# Patient Record
Sex: Female | Born: 1985 | Race: White | Hispanic: No | Marital: Married | Smoking: Never smoker
Health system: Southern US, Community
[De-identification: ages and names within clinical notes are randomized; demographics above are authoritative.]

## PROBLEM LIST (undated history)

## (undated) ENCOUNTER — Inpatient Hospital Stay (HOSPITAL_COMMUNITY): Payer: Self-pay

## (undated) DIAGNOSIS — E282 Polycystic ovarian syndrome: Secondary | ICD-10-CM

## (undated) DIAGNOSIS — K589 Irritable bowel syndrome without diarrhea: Secondary | ICD-10-CM

## (undated) DIAGNOSIS — K219 Gastro-esophageal reflux disease without esophagitis: Secondary | ICD-10-CM

## (undated) DIAGNOSIS — F411 Generalized anxiety disorder: Secondary | ICD-10-CM

## (undated) DIAGNOSIS — F32A Depression, unspecified: Secondary | ICD-10-CM

## (undated) DIAGNOSIS — D649 Anemia, unspecified: Secondary | ICD-10-CM

## (undated) DIAGNOSIS — M1990 Unspecified osteoarthritis, unspecified site: Secondary | ICD-10-CM

## (undated) HISTORY — PX: DRUG INDUCED ENDOSCOPY: SHX6808

---

## 2007-10-15 ENCOUNTER — Ambulatory Visit (HOSPITAL_COMMUNITY): Admission: RE | Admit: 2007-10-15 | Discharge: 2007-10-15 | Payer: Self-pay | Admitting: Obstetrics & Gynecology

## 2007-11-13 ENCOUNTER — Ambulatory Visit (HOSPITAL_COMMUNITY): Admission: RE | Admit: 2007-11-13 | Discharge: 2007-11-13 | Payer: Self-pay | Admitting: Obstetrics and Gynecology

## 2008-01-07 ENCOUNTER — Inpatient Hospital Stay (HOSPITAL_COMMUNITY): Admission: AD | Admit: 2008-01-07 | Discharge: 2008-01-10 | Payer: Self-pay | Admitting: Obstetrics and Gynecology

## 2011-03-13 LAB — RH IMMUNE GLOBULIN WORKUP (NOT WOMEN'S HOSP): ABO/RH(D): O NEG

## 2011-03-13 LAB — CBC
HCT: 32.8 — ABNORMAL LOW
Hemoglobin: 11.4 — ABNORMAL LOW
MCHC: 34.6
RBC: 3.85 — ABNORMAL LOW
RDW: 12.8

## 2011-03-13 LAB — RPR: RPR Ser Ql: NONREACTIVE

## 2011-03-16 LAB — CBC
HCT: 30.2 — ABNORMAL LOW
MCHC: 33
MCV: 79.1
Platelets: 206
Platelets: 259
RBC: 4.21
RDW: 14.7
WBC: 10.9 — ABNORMAL HIGH
WBC: 16.3 — ABNORMAL HIGH

## 2011-03-16 LAB — RPR: RPR Ser Ql: NONREACTIVE

## 2011-03-26 ENCOUNTER — Other Ambulatory Visit: Payer: Self-pay | Admitting: Family Medicine

## 2011-03-26 DIAGNOSIS — R102 Pelvic and perineal pain: Secondary | ICD-10-CM

## 2011-03-29 ENCOUNTER — Ambulatory Visit (HOSPITAL_COMMUNITY)
Admission: RE | Admit: 2011-03-29 | Discharge: 2011-03-29 | Disposition: A | Discharge status: Home / Self Care | Payer: BC Managed Care – PPO | Source: Ambulatory Visit | Attending: Family Medicine | Admitting: Family Medicine

## 2011-03-29 DIAGNOSIS — R102 Pelvic and perineal pain: Secondary | ICD-10-CM

## 2011-03-29 DIAGNOSIS — N949 Unspecified condition associated with female genital organs and menstrual cycle: Secondary | ICD-10-CM | POA: Insufficient documentation

## 2012-06-18 NOTE — L&D Delivery Note (Signed)
Delivery Note At 8:50 AM a viable and healthy female was delivered via Vaginal, Spontaneous Delivery (Presentation: Left Occiput Anterior).  APGAR: 9, 9; weight pending.   Placenta status: Intact, Spontaneous.  Cord: 3 vessels with the following complications: None.    Anesthesia:  Epidural Episiotomy: None Lacerations: None Suture Repair: none Est. Blood Loss (mL): 250  Mom to postpartum.  Baby to nursery-stable.  Everardo Voris H. 03/26/2013, 10:04 AM

## 2012-09-08 LAB — OB RESULTS CONSOLE HIV ANTIBODY (ROUTINE TESTING): HIV: NONREACTIVE

## 2012-10-20 IMAGING — US US PELVIS COMPLETE
1 series · 14 of 25 positions shown · non-contrast
Comparison: Obstetric ultrasound 11/13/2007.

CLINICAL DATA: Pelvic pain.

TRANSABDOMINAL AND TRANSVAGINAL ULTRASOUND OF PELVIS
TECHNIQUE: Both transabdominal and transvaginal ultrasound
examinations of the pelvis were performed. Transabdominal technique
was performed for global imaging of the pelvis including uterus,
ovaries, adnexal regions, and pelvic cul-de-sac.

[Series 1: us pelvis complete · 0.30mm/px · 14 of 52 slices shown]
[im 1/52]
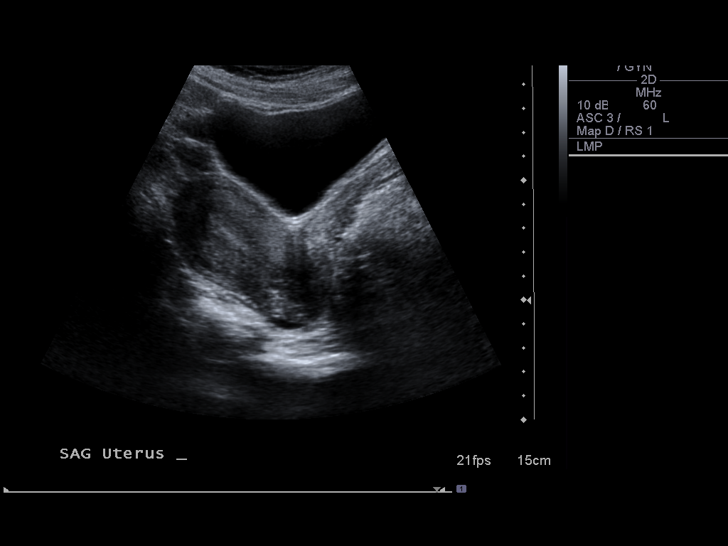
[im 5/52]
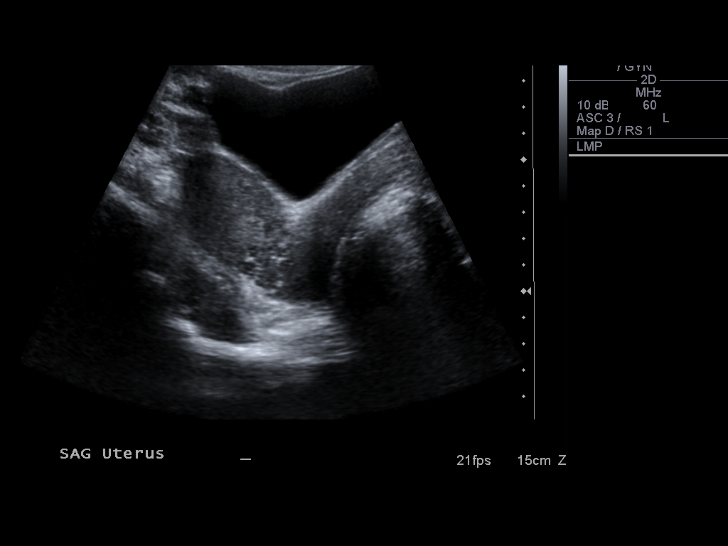
[im 9/52]
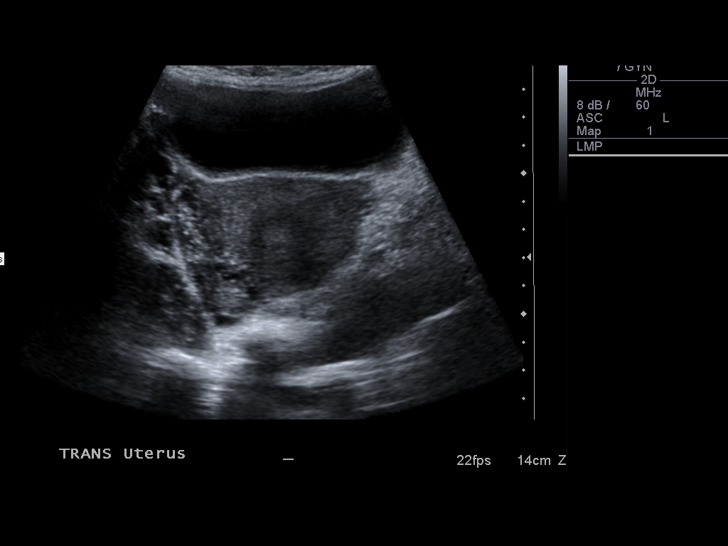
[im 13/52]
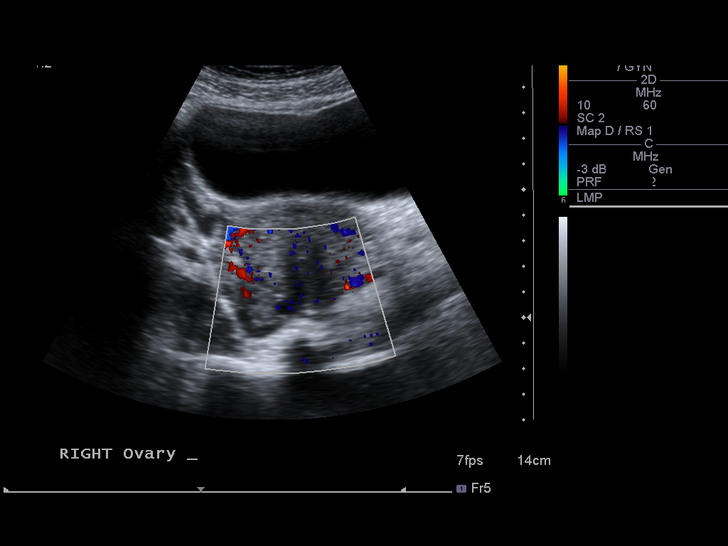
[im 18/52]
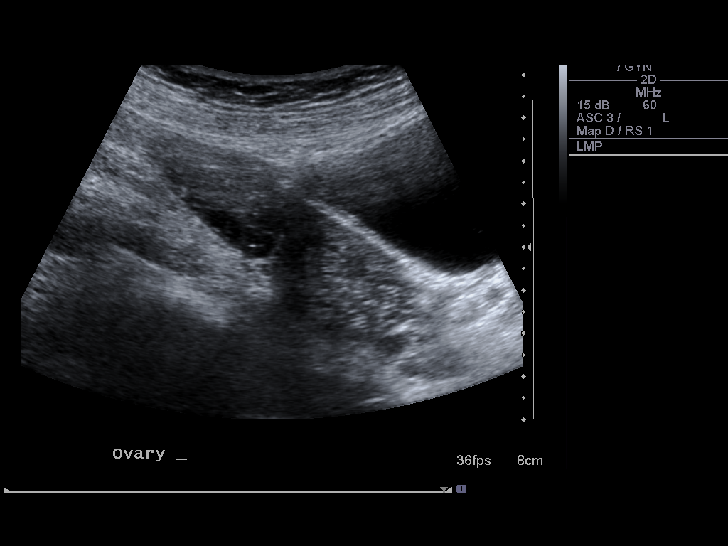
[im 20/52]
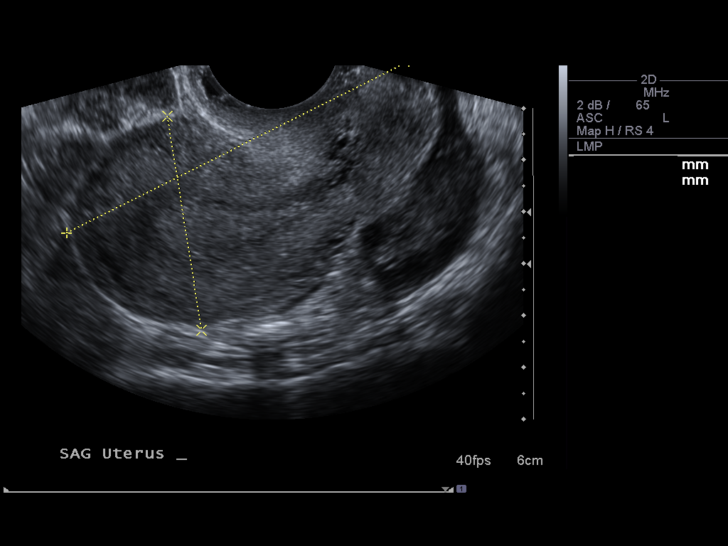
[im 24/52]
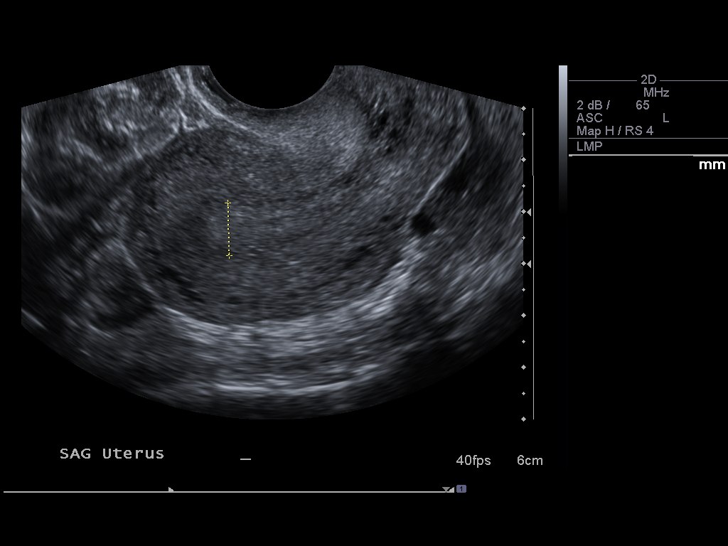
[im 28/52]
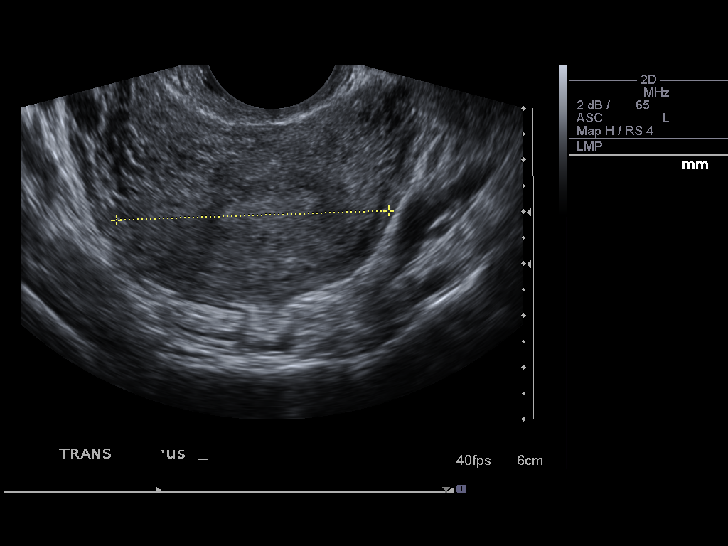
[im 32/52]
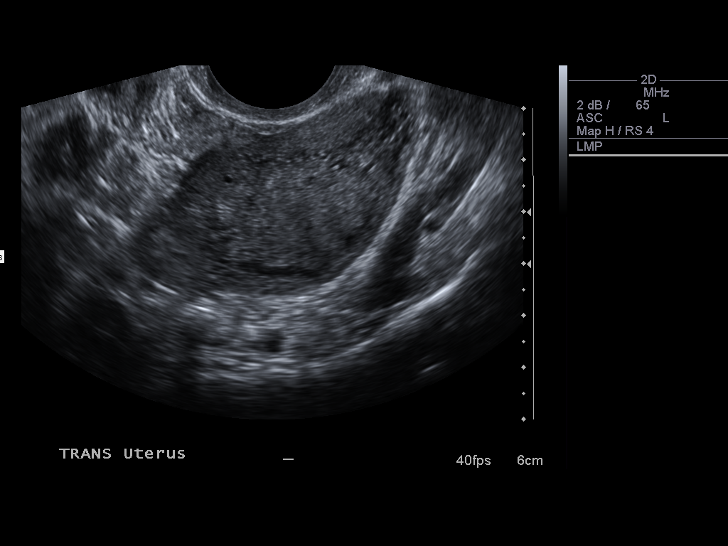
[im 35/52]
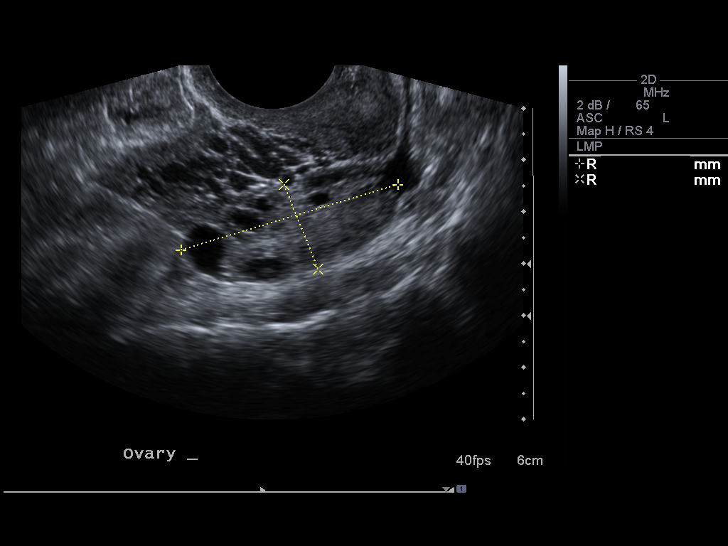
[im 39/52]
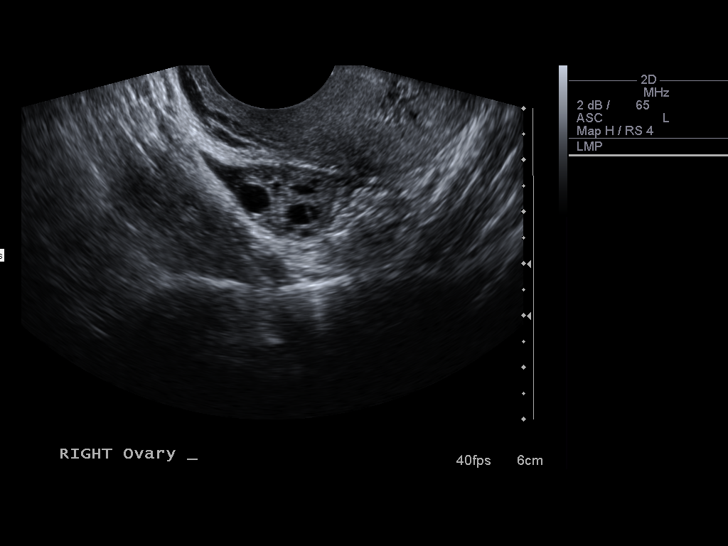
[im 43/52]
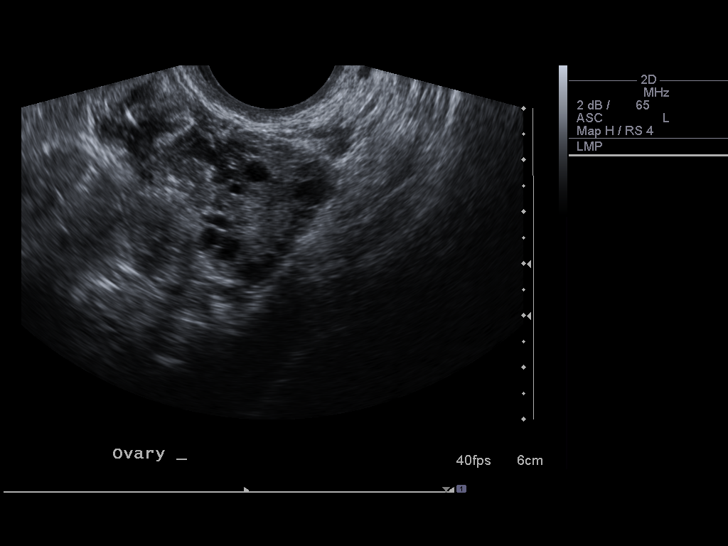
[im 47/52]
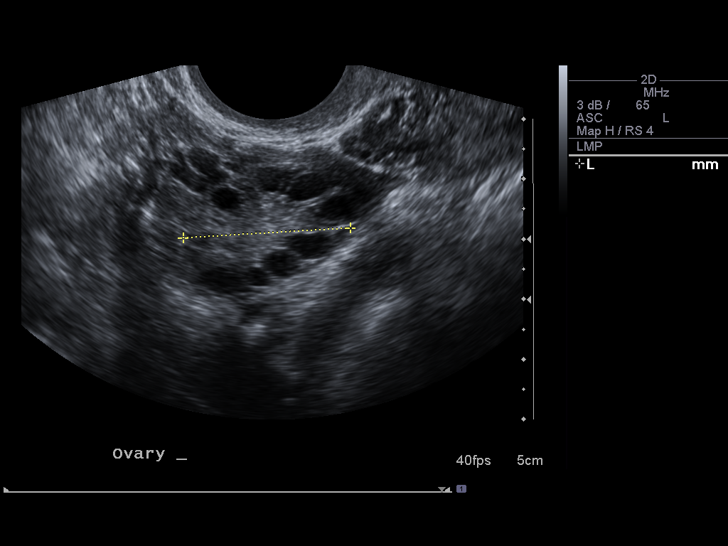
[im 52/52]
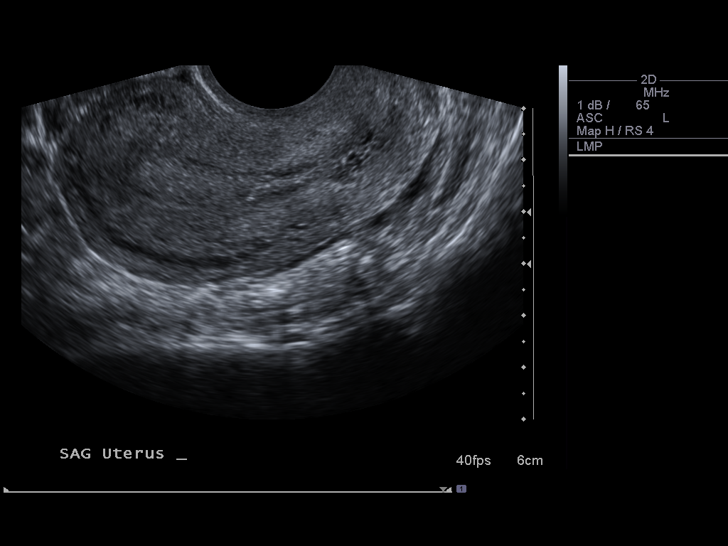

[14 of 25 positions shown; findings below may reference images not displayed]

It was necessary to proceed with endovaginal exam following the
transabdominal exam to visualize the uterus, ovaries, and adnexa  .
FINDINGS: Uterus: Normal in size and appearance

Endometrium: Normal, at 10 mm.

Right ovary:  Normal appearance/no adnexal mass.  4.4 x 1.8 x
cm

Left ovary: Normal appearance/no adnexal mass.  3.0 x 1.8 x 2.8 cm.

Other findings: Minimal fluid adjacent the right ovary, likely
physiologic.
IMPRESSION: Normal pelvic ultrasound for age.

## 2012-11-10 ENCOUNTER — Encounter (HOSPITAL_COMMUNITY): Payer: Self-pay

## 2012-11-10 ENCOUNTER — Inpatient Hospital Stay (HOSPITAL_COMMUNITY)
Admission: AD | Admit: 2012-11-10 | Discharge: 2012-11-10 | Disposition: A | Discharge status: Home / Self Care | Payer: No Typology Code available for payment source | Source: Ambulatory Visit | Attending: Obstetrics and Gynecology | Admitting: Obstetrics and Gynecology

## 2012-11-10 DIAGNOSIS — M545 Low back pain, unspecified: Secondary | ICD-10-CM | POA: Insufficient documentation

## 2012-11-10 DIAGNOSIS — O99891 Other specified diseases and conditions complicating pregnancy: Secondary | ICD-10-CM

## 2012-11-10 DIAGNOSIS — Y92410 Unspecified street and highway as the place of occurrence of the external cause: Secondary | ICD-10-CM | POA: Insufficient documentation

## 2012-11-10 HISTORY — DX: Generalized anxiety disorder: F41.1

## 2012-11-10 HISTORY — DX: Polycystic ovarian syndrome: E28.2

## 2012-11-10 LAB — URINALYSIS, ROUTINE W REFLEX MICROSCOPIC
Leukocytes, UA: NEGATIVE
Nitrite: NEGATIVE
Specific Gravity, Urine: 1.025 (ref 1.005–1.030)
Urobilinogen, UA: 1 mg/dL (ref 0.0–1.0)
pH: 6 (ref 5.0–8.0)
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
## 2012-11-19 NOTE — MAU Provider Note (Signed)
Agree with above, confirmed that tracing was reactive with no contractions.

## 2013-01-01 ENCOUNTER — Inpatient Hospital Stay (HOSPITAL_COMMUNITY)
Admission: AD | Admit: 2013-01-01 | Discharge: 2013-01-01 | Disposition: A | Discharge status: Home / Self Care | Payer: Federal, State, Local not specified - PPO | Source: Ambulatory Visit | Attending: Obstetrics and Gynecology | Admitting: Obstetrics and Gynecology

## 2013-01-01 ENCOUNTER — Other Ambulatory Visit: Payer: Self-pay

## 2013-01-01 ENCOUNTER — Encounter (HOSPITAL_COMMUNITY): Payer: Self-pay | Admitting: *Deleted

## 2013-01-01 DIAGNOSIS — I498 Other specified cardiac arrhythmias: Secondary | ICD-10-CM

## 2013-01-01 DIAGNOSIS — O99891 Other specified diseases and conditions complicating pregnancy: Secondary | ICD-10-CM

## 2013-01-01 DIAGNOSIS — Z3492 Encounter for supervision of normal pregnancy, unspecified, second trimester: Secondary | ICD-10-CM

## 2013-01-01 DIAGNOSIS — R002 Palpitations: Secondary | ICD-10-CM

## 2013-01-01 LAB — GLUCOSE, CAPILLARY: Glucose-Capillary: 54 mg/dL — ABNORMAL LOW (ref 70–99)

## 2013-01-01 NOTE — MAU Note (Signed)
Sent from office for further eval, EKG

## 2013-01-01 NOTE — MAU Provider Note (Signed)
  History     CSN: [id number]  Arrival date and time: 01/01/13 1644   None     Chief Complaint  Patient presents with  . Palpitations   HPI Cassandra Mckay is 27 y.o. G2P1001 [redacted]w[redacted]d weeks sent from the office by Dr. Henderson Cloud when the patient reported she had had palpitations.  They began 1 week ago and thought it was the caffeine in the coke she had a dinner.  Resting heart rate at that time was "70 but thought it was skipping".  Lasted 6 hrs then stopped and has not recurred.   Had glucose screening today at 3:20.  Hasn't eaten since 2 hrs before the test.     Past Medical History  Diagnosis Date  . PCOS (polycystic ovarian syndrome)   . GAD (generalized anxiety disorder)     Not currently    Past Surgical History  Procedure Laterality Date  . Vaginal delivery      Family History  Problem Relation Age of Onset  . Thyroid disease Mother   . Thyroid disease Maternal Grandmother     History  Substance Use Topics  . Smoking status: Never Smoker   . Smokeless tobacco: Never Used  . Alcohol Use: No    Allergies:  Allergies  Allergen Reactions  . Benadryl (Diphenhydramine Hcl) Other (See Comments)    Family history; Pt does not want to try.    Prescriptions prior to admission  Medication Sig Dispense Refill  . acetaminophen (TYLENOL) 325 MG tablet Take 325 mg by mouth every 6 (six) hours as needed for fever.      Marland Kitchen CALCIUM PO Take 1 tablet by mouth daily.      Marland Kitchen FIBER SELECT GUMMIES PO Take by mouth.      . loratadine (CLARITIN) 10 MG tablet Take 10 mg by mouth daily.      . Prenatal Vit-Fe Fumarate-FA (PRENATAL MULTIVITAMIN) TABS Take 1 tablet by mouth daily at 12 noon.        Review of Systems  Constitutional: Negative for fever and chills.  Cardiovascular: Positive for palpitations (1 week ago). Negative for chest pain.  Gastrointestinal: Negative for nausea, vomiting and abdominal pain.  Neurological: Negative for headaches.   Physical Exam   Blood  pressure 112/67, pulse 84, resp. rate 16.  Physical Exam  Constitutional: She is oriented to person, place, and time. She appears well-developed and well-nourished. No distress.  Cardiovascular: Normal rate.   Respiratory: Effort normal.  Genitourinary:  Not indicated  Neurological: She is alert and oriented to person, place, and time.  Skin: Skin is warm and dry.  Psychiatric: She has a normal mood and affect. Her behavior is normal.    MAU Course  Procedures  FMS--Reactive.  Baseline 130 No decels, no contractions                      EKG--nSR with Sinus arrhythmia  "Normal ECG.  Rate 70  MDM 17:35  Reported MSE, EKG results to Dr. Tenny Craw.  Order given for discharge.  To report any further palpitation.  Patient denies palpitations while in MAU  Assessment and Plan  A:  Recent palpitations      Normal EKG  P:  Report palpitation recurrence to Dr. Fenton Foy M 01/01/2013, 5:33 PM

## 2013-02-26 ENCOUNTER — Other Ambulatory Visit: Payer: Self-pay

## 2013-02-26 LAB — OB RESULTS CONSOLE ABO/RH: RH Type: NEGATIVE

## 2013-02-26 LAB — OB RESULTS CONSOLE RPR: RPR: NONREACTIVE

## 2013-02-26 LAB — OB RESULTS CONSOLE ANTIBODY SCREEN: Antibody Screen: NEGATIVE

## 2013-03-25 ENCOUNTER — Inpatient Hospital Stay (HOSPITAL_COMMUNITY)
Admission: AD | Admit: 2013-03-25 | Discharge: 2013-03-25 | Disposition: A | Discharge status: Home / Self Care | Payer: Federal, State, Local not specified - PPO | Source: Ambulatory Visit | Attending: Obstetrics and Gynecology | Admitting: Obstetrics and Gynecology

## 2013-03-25 ENCOUNTER — Encounter (HOSPITAL_COMMUNITY): Payer: Self-pay | Admitting: *Deleted

## 2013-03-25 DIAGNOSIS — O479 False labor, unspecified: Secondary | ICD-10-CM | POA: Insufficient documentation

## 2013-03-25 NOTE — MAU Note (Signed)
Patient presents to MAU with c/o of contractions 7-10 minutes apart. States lost mucus plug today. Denies LOF nor bleeding at this time. REports good fetal movement. States was last checked Friday being 1.5cm/50%.

## 2013-03-26 ENCOUNTER — Encounter (HOSPITAL_COMMUNITY): Payer: Federal, State, Local not specified - PPO | Admitting: Anesthesiology

## 2013-03-26 ENCOUNTER — Encounter (HOSPITAL_COMMUNITY): Payer: Self-pay

## 2013-03-26 ENCOUNTER — Inpatient Hospital Stay (HOSPITAL_COMMUNITY): Payer: Federal, State, Local not specified - PPO | Admitting: Anesthesiology

## 2013-03-26 ENCOUNTER — Inpatient Hospital Stay (HOSPITAL_COMMUNITY)
Admission: AD | Admit: 2013-03-26 | Discharge: 2013-03-27 | DRG: 373 | Disposition: A | Discharge status: Home / Self Care | Payer: Federal, State, Local not specified - PPO | Source: Ambulatory Visit | Attending: Obstetrics and Gynecology | Admitting: Obstetrics and Gynecology

## 2013-03-26 DIAGNOSIS — E282 Polycystic ovarian syndrome: Secondary | ICD-10-CM | POA: Diagnosis present

## 2013-03-26 DIAGNOSIS — O34599 Maternal care for other abnormalities of gravid uterus, unspecified trimester: Principal | ICD-10-CM | POA: Diagnosis present

## 2013-03-26 DIAGNOSIS — O479 False labor, unspecified: Secondary | ICD-10-CM

## 2013-03-26 LAB — CBC
Hemoglobin: 10.5 g/dL — ABNORMAL LOW (ref 12.0–15.0)
MCHC: 32.9 g/dL (ref 30.0–36.0)
Platelets: 228 10*3/uL (ref 150–400)
RBC: 4.2 MIL/uL (ref 3.87–5.11)
RDW: 13.7 % (ref 11.5–15.5)

## 2013-03-26 LAB — RPR: RPR Ser Ql: NONREACTIVE

## 2013-03-26 LAB — POCT FERN TEST: POCT Fern Test: POSITIVE

## 2013-03-26 LAB — OB RESULTS CONSOLE GBS: GBS: NEGATIVE

## 2013-03-26 MED ORDER — OXYTOCIN 40 UNITS IN LACTATED RINGERS INFUSION - SIMPLE MED
62.5000 mL/h | INTRAVENOUS | Status: DC
  Filled 2013-03-26: qty 1000

## 2013-03-26 MED ORDER — TETANUS-DIPHTH-ACELL PERTUSSIS 5-2.5-18.5 LF-MCG/0.5 IM SUSP
0.5000 mL | Freq: Once | INTRAMUSCULAR | Status: AC
  Administered 2013-03-27: 0.5 mL via INTRAMUSCULAR
  Filled 2013-03-26: qty 0.5

## 2013-03-26 MED ORDER — FENTANYL 2.5 MCG/ML BUPIVACAINE 1/10 % EPIDURAL INFUSION (WH - ANES)
14.0000 mL/h | INTRAMUSCULAR | Status: DC | PRN
  Administered 2013-03-26: 14 mL/h via EPIDURAL
  Filled 2013-03-26: qty 125

## 2013-03-26 MED ORDER — BENZOCAINE-MENTHOL 20-0.5 % EX AERO: 1.0000 "application " | INHALATION_SPRAY | CUTANEOUS | Status: DC | PRN

## 2013-03-26 MED ORDER — SODIUM BICARBONATE 8.4 % IV SOLN
INTRAVENOUS | Status: DC | PRN
  Administered 2013-03-26: 5 mL via EPIDURAL

## 2013-03-26 MED ORDER — OXYTOCIN BOLUS FROM INFUSION: 500.0000 mL | INTRAVENOUS | Status: DC

## 2013-03-26 MED ORDER — LACTATED RINGERS IV SOLN
INTRAVENOUS | Status: DC
  Administered 2013-03-26: 125 mL/h via INTRAVENOUS
  Administered 2013-03-26: 07:00:00 via INTRAVENOUS

## 2013-03-26 MED ORDER — LACTATED RINGERS IV SOLN
500.0000 mL | INTRAVENOUS | Status: DC | PRN
  Administered 2013-03-26: 500 mL via INTRAVENOUS

## 2013-03-26 MED ORDER — ZOLPIDEM TARTRATE 5 MG PO TABS: 5.0000 mg | ORAL_TABLET | Freq: Every evening | ORAL | Status: DC | PRN

## 2013-03-26 MED ORDER — ACETAMINOPHEN 325 MG PO TABS: 650.0000 mg | ORAL_TABLET | ORAL | Status: DC | PRN

## 2013-03-26 MED ORDER — METHYLERGONOVINE MALEATE 0.2 MG PO TABS: 0.2000 mg | ORAL_TABLET | ORAL | Status: DC | PRN

## 2013-03-26 MED ORDER — ONDANSETRON HCL 4 MG PO TABS: 4.0000 mg | ORAL_TABLET | ORAL | Status: DC | PRN

## 2013-03-26 MED ORDER — ONDANSETRON HCL 4 MG/2ML IJ SOLN: 4.0000 mg | INTRAMUSCULAR | Status: DC | PRN

## 2013-03-26 MED ORDER — IBUPROFEN 600 MG PO TABS: 600.0000 mg | ORAL_TABLET | Freq: Four times a day (QID) | ORAL | Status: DC | PRN

## 2013-03-26 MED ORDER — METHYLERGONOVINE MALEATE 0.2 MG/ML IJ SOLN: 0.2000 mg | INTRAMUSCULAR | Status: DC | PRN

## 2013-03-26 MED ORDER — OXYCODONE-ACETAMINOPHEN 5-325 MG PO TABS: 1.0000 | ORAL_TABLET | ORAL | Status: DC | PRN

## 2013-03-26 MED ORDER — FLEET ENEMA 7-19 GM/118ML RE ENEM: 1.0000 | ENEMA | RECTAL | Status: DC | PRN

## 2013-03-26 MED ORDER — PRENATAL MULTIVITAMIN CH
1.0000 | ORAL_TABLET | Freq: Every day | ORAL | Status: DC
  Administered 2013-03-27: 1 via ORAL
  Filled 2013-03-26: qty 1

## 2013-03-26 MED ORDER — LACTATED RINGERS IV SOLN
500.0000 mL | Freq: Once | INTRAVENOUS | Status: AC
  Administered 2013-03-26: 500 mL via INTRAVENOUS

## 2013-03-26 MED ORDER — CITRIC ACID-SODIUM CITRATE 334-500 MG/5ML PO SOLN: 30.0000 mL | ORAL | Status: DC | PRN

## 2013-03-26 MED ORDER — EPHEDRINE 5 MG/ML INJ
10.0000 mg | INTRAVENOUS | Status: DC | PRN
  Filled 2013-03-26: qty 2
  Filled 2013-03-26: qty 4

## 2013-03-26 MED ORDER — PHENYLEPHRINE 40 MCG/ML (10ML) SYRINGE FOR IV PUSH (FOR BLOOD PRESSURE SUPPORT)
80.0000 ug | PREFILLED_SYRINGE | INTRAVENOUS | Status: DC | PRN
  Filled 2013-03-26: qty 2

## 2013-03-26 MED ORDER — ONDANSETRON HCL 4 MG/2ML IJ SOLN: 4.0000 mg | Freq: Four times a day (QID) | INTRAMUSCULAR | Status: DC | PRN

## 2013-03-26 MED ORDER — IBUPROFEN 600 MG PO TABS
600.0000 mg | ORAL_TABLET | Freq: Four times a day (QID) | ORAL | Status: DC
  Administered 2013-03-26 – 2013-03-27 (×3): 600 mg via ORAL
  Filled 2013-03-26 (×4): qty 1

## 2013-03-26 MED ORDER — PHENYLEPHRINE 40 MCG/ML (10ML) SYRINGE FOR IV PUSH (FOR BLOOD PRESSURE SUPPORT)
80.0000 ug | PREFILLED_SYRINGE | INTRAVENOUS | Status: DC | PRN
  Filled 2013-03-26: qty 2
  Filled 2013-03-26: qty 5

## 2013-03-26 MED ORDER — LIDOCAINE HCL (PF) 1 % IJ SOLN
30.0000 mL | INTRAMUSCULAR | Status: DC | PRN
  Filled 2013-03-26 (×2): qty 30

## 2013-03-26 MED ORDER — WITCH HAZEL-GLYCERIN EX PADS: 1.0000 "application " | MEDICATED_PAD | CUTANEOUS | Status: DC | PRN

## 2013-03-26 MED ORDER — SENNOSIDES-DOCUSATE SODIUM 8.6-50 MG PO TABS
2.0000 | ORAL_TABLET | ORAL | Status: DC
  Administered 2013-03-27: 2 via ORAL
  Filled 2013-03-26: qty 2

## 2013-03-26 MED ORDER — LANOLIN HYDROUS EX OINT: TOPICAL_OINTMENT | CUTANEOUS | Status: DC | PRN

## 2013-03-26 MED ORDER — EPHEDRINE 5 MG/ML INJ
10.0000 mg | INTRAVENOUS | Status: DC | PRN
  Filled 2013-03-26: qty 2

## 2013-03-26 MED ORDER — DIBUCAINE 1 % RE OINT: 1.0000 "application " | TOPICAL_OINTMENT | RECTAL | Status: DC | PRN

## 2013-03-26 MED ORDER — SIMETHICONE 80 MG PO CHEW: 80.0000 mg | CHEWABLE_TABLET | ORAL | Status: DC | PRN

## 2013-03-26 NOTE — Anesthesia Procedure Notes (Signed)

## 2013-03-26 NOTE — Progress Notes (Addendum)
Fentanyl 2.5mg /ml with bupiacaine 0.1% in NS: 75.6 ml wasted in med room sink. Aldean Baker, RN witnessed. Venda Rodes, RN

## 2013-03-26 NOTE — MAU Note (Signed)
Cassandra Mckay CNM in room to do speculum to r/o SROM

## 2013-03-26 NOTE — H&P (Signed)
Cassandra Mckay is a 27 y.o. female presenting for labor   History OB History   Grav Para Term Preterm Abortions TAB SAB Ect Mult Living   2 1 1       1      Past Medical History  Diagnosis Date  . PCOS (polycystic ovarian syndrome)   . GAD (generalized anxiety disorder)     Not currently   Past Surgical History  Procedure Laterality Date  . Vaginal delivery     Family History: family history includes Thyroid disease in her maternal grandmother and mother. Social History:  reports that she has never smoked. She has never used smokeless tobacco. She reports that she does not drink alcohol or use illicit drugs.   Prenatal Transfer Tool  Maternal Diabetes: No Genetic Screening: Normal Maternal Ultrasounds/Referrals: Normal Fetal Ultrasounds or other Referrals:  None Maternal Substance Abuse:  No Significant Maternal Medications:  None Significant Maternal Lab Results:  None Other Comments:  None  ROS:   Dilation: 10 Effacement (%): 100 Station: +2 Exam by:: Marijean Heath, RN Blood pressure 121/74, pulse 50, temperature 97.3 F (36.3 C), temperature source Oral, resp. rate 24, height 5\' 4"  (1.626 m), weight 76.658 kg (169 lb), SpO2 99.00%. Exam Physical Exam  Prenatal labs: ABO, Rh: O/Negative/-- (09/11 0000) Antibody: Negative (09/11 0000) Rubella: Immune (03/24 0000) RPR: Nonreactive (09/11 0000)  HBsAg: Negative (03/24 0000)  HIV: Non-reactive (03/24 0000)  GBS: Negative (10/09 0000)   Assessment/Plan: 1) Admit 2) Anticipate svd   Alex Mcmanigal H. 03/26/2013, 9:55 AM

## 2013-03-26 NOTE — MAU Note (Signed)
Pt states SROM at 4am. Clear fluid. UC every 4 mins.

## 2013-03-26 NOTE — MAU Provider Note (Signed)
Asked to do Speculum exam to r/o ROM  + small pooling Mucous + fern Cervix 4-5/85/-1/vtx  FHR reassuring UCs q 2-3 min  RN will call Dr Claiborne Billings

## 2013-03-26 NOTE — Anesthesia Preprocedure Evaluation (Addendum)

## 2013-03-27 LAB — CBC
HCT: 28.9 % — ABNORMAL LOW (ref 36.0–46.0)
MCH: 25.3 pg — ABNORMAL LOW (ref 26.0–34.0)
MCHC: 33.2 g/dL (ref 30.0–36.0)
RDW: 13.8 % (ref 11.5–15.5)

## 2013-03-27 MED ORDER — INFLUENZA VAC SPLIT QUAD 0.5 ML IM SUSP
0.5000 mL | INTRAMUSCULAR | Status: AC
  Administered 2013-03-27: 0.5 mL via INTRAMUSCULAR

## 2013-03-27 MED ORDER — RHO D IMMUNE GLOBULIN 1500 UNIT/2ML IJ SOLN
300.0000 ug | Freq: Once | INTRAMUSCULAR | Status: AC
  Administered 2013-03-27: 300 ug via INTRAMUSCULAR
  Filled 2013-03-27: qty 2

## 2013-03-27 NOTE — Progress Notes (Signed)

## 2013-03-27 NOTE — Progress Notes (Signed)
Patient is eating, ambulating, voiding.  Pain control is good.  Filed Vitals:   03/26/13 1330 03/26/13 1712 03/26/13 2140 03/27/13 0509  BP: 132/72 114/70 111/60 113/74  Pulse: 48 58 63 56  Temp: 98 F (36.7 C) 97.4 F (36.3 C) 97.6 F (36.4 C) 98.2 F (36.8 C)  TempSrc:   Oral Oral  Resp: 18 16 18 16   Height:      Weight:      SpO2:        Fundus firm Perineum without swelling.  Lab Results  Component Value Date   WBC 8.5 03/27/2013   HGB 9.6* 03/27/2013   HCT 28.9* 03/27/2013   MCV 76.1* 03/27/2013   PLT 153 03/27/2013    O/Negative/-- (09/11 0000)/RI  Mckay/P Post partum day 1.  Routine care.  Expect d/c tomorrow.   Baby Rh +-- pt needs Rhogam. Cassandra Mckay

## 2013-03-27 NOTE — Discharge Summary (Signed)
Obstetric Discharge Summary Reason for Admission: onset of labor Prenatal Procedures: none Intrapartum Procedures: spontaneous vaginal delivery Postpartum Procedures: Rho(D) Ig Complications-Operative and Postpartum: none Hemoglobin  Date Value Range Status  03/27/2013 9.6* 12.0 - 15.0 g/dL Final     HCT  Date Value Range Status  03/27/2013 28.9* 36.0 - 46.0 % Final    Discharge Diagnoses: Term Pregnancy-delivered  Discharge Information: Date: 03/27/2013 Activity: pelvic rest Diet: routine Medications: Ibuprofen and Iron Condition: stable Instructions: refer to practice specific booklet Discharge to: home Follow-up Information   Follow up with Almon Hercules., MD In 4 weeks.   Specialty:  Obstetrics and Gynecology   Contact information:   [address] [place] [address] [phone]       Newborn Data: Live born female  Birth Weight: 7 lb 14 oz (3572 g) APGAR: 9, 9  Home with mother.  Aura Bibby A 03/27/2013, 7:52 AM

## 2013-03-27 NOTE — Anesthesia Postprocedure Evaluation (Signed)
Anesthesia Post Note  Patient: Cassandra Mckay  Procedure(s) Performed: * No procedures listed *  Anesthesia type: Epidural  Patient location: Mother/Baby  Post pain: Pain level controlled  Post assessment: Post-op Vital signs reviewed  Last Vitals: There were no vitals filed for this visit.  Post vital signs: Reviewed  Level of consciousness:alert  Complications: No apparent anesthesia complications

## 2013-03-28 LAB — RH IG WORKUP (INCLUDES ABO/RH)
ABO/RH(D): O NEG
Gestational Age(Wks): 39.6
Unit division: 0

## 2014-01-11 ENCOUNTER — Encounter: Payer: Self-pay | Admitting: Family Medicine

## 2014-01-11 ENCOUNTER — Ambulatory Visit (INDEPENDENT_AMBULATORY_CARE_PROVIDER_SITE_OTHER): Payer: BC Managed Care – PPO | Admitting: Family Medicine

## 2014-01-11 VITALS — BP 114/72 | HR 85 | Temp 98.7°F | Ht 64.25 in | Wt 155.4 lb

## 2014-01-11 DIAGNOSIS — B354 Tinea corporis: Secondary | ICD-10-CM

## 2014-01-11 MED ORDER — KETOCONAZOLE 2 % EX CREA: 1.0000 "application " | TOPICAL_CREAM | Freq: Every day | CUTANEOUS | Status: DC

## 2014-01-11 MED ORDER — TERBINAFINE HCL 250 MG PO TABS: 250.0000 mg | ORAL_TABLET | Freq: Every day | ORAL | Status: DC

## 2014-01-11 NOTE — Progress Notes (Signed)
   Subjective:    Patient ID: Cassandra Mckay, female    DOB: October 10, 1985, 28 y.o.   MRN: [id number]  HPI  This 28 y.o. female presents for evaluation of ring worm rash on left leg and knee area.  Review of Systems C/o rash   No chest pain, SOB, HA, dizziness, vision change, N/V, diarrhea, constipation, dysuria, urinary urgency or frequency, myalgias, arthralgias.  Objective:   Physical Exam Vital signs noted  Well developed well nourished female.  HEENT - Head atraumatic Normocephalic Respiratory - Lungs CTA bilateral Cardiac - RRR S1 and S2 without murmur Skin - Anular rash on left lateral knee      Assessment & Plan:  Tinea corporis - Plan: ketoconazole (NIZORAL) 2 % cream, terbinafine (LAMISIL) 250 MG tablet  Deatra CanterWilliam J Oxford FNP

## 2014-04-19 ENCOUNTER — Encounter: Payer: Self-pay | Admitting: Family Medicine

## 2015-04-01 ENCOUNTER — Ambulatory Visit (INDEPENDENT_AMBULATORY_CARE_PROVIDER_SITE_OTHER): Payer: BLUE CROSS/BLUE SHIELD

## 2015-04-01 DIAGNOSIS — Z23 Encounter for immunization: Secondary | ICD-10-CM | POA: Diagnosis not present

## 2018-09-17 ENCOUNTER — Telehealth (HOSPITAL_COMMUNITY): Payer: Self-pay

## 2018-09-17 NOTE — Telephone Encounter (Signed)
Mother of patient was contacted today regarding the temporary reduction of OP Rehab services due to concerns for community transmission of Covid-19.  Therapist advised the patient to continue to perform the HEP and assured they had no unaswered questions at this time.  Patient expressed interest in telehealth session to continue their POC, when those services become available.    Outpatient Rehab Services will follow up at that time.  Athena Masse  M.A., CCC-SLP [email or web address] [email or web address]

## 2020-11-16 ENCOUNTER — Other Ambulatory Visit: Payer: BLUE CROSS/BLUE SHIELD

## 2020-11-16 ENCOUNTER — Other Ambulatory Visit (HOSPITAL_COMMUNITY)
Admission: RE | Admit: 2020-11-16 | Discharge: 2020-11-16 | Disposition: A | Discharge status: Home / Self Care | Payer: BLUE CROSS/BLUE SHIELD | Source: Ambulatory Visit | Attending: Otolaryngology | Admitting: Otolaryngology

## 2020-11-16 DIAGNOSIS — Z20822 Contact with and (suspected) exposure to covid-19: Secondary | ICD-10-CM | POA: Diagnosis not present

## 2020-11-16 DIAGNOSIS — Z01812 Encounter for preprocedural laboratory examination: Secondary | ICD-10-CM | POA: Diagnosis present

## 2020-11-16 LAB — SARS CORONAVIRUS 2 (TAT 6-24 HRS): SARS Coronavirus 2: NEGATIVE

## 2020-11-17 ENCOUNTER — Encounter (HOSPITAL_COMMUNITY): Payer: Self-pay | Admitting: Otolaryngology

## 2020-11-17 ENCOUNTER — Other Ambulatory Visit: Payer: Self-pay | Admitting: Otolaryngology

## 2020-11-17 NOTE — Progress Notes (Signed)
Spoke with pt for pre-op call. Pt denies cardiac history, HTN or Diabetes.  Covid test done on 11/16/20 and it's negative. Pt understands to wear her mask when she goes out and is around other people.

## 2020-11-18 ENCOUNTER — Encounter (HOSPITAL_COMMUNITY): Payer: Self-pay | Admitting: Otolaryngology

## 2020-11-18 ENCOUNTER — Encounter (HOSPITAL_COMMUNITY)
Admission: RE | Disposition: A | Discharge status: Home / Self Care | Payer: Self-pay | Source: Home / Self Care | Attending: Otolaryngology

## 2020-11-18 ENCOUNTER — Ambulatory Visit (HOSPITAL_COMMUNITY): Payer: BLUE CROSS/BLUE SHIELD | Admitting: Certified Registered Nurse Anesthetist

## 2020-11-18 ENCOUNTER — Other Ambulatory Visit: Payer: Self-pay

## 2020-11-18 ENCOUNTER — Ambulatory Visit (HOSPITAL_COMMUNITY)
Admission: RE | Admit: 2020-11-18 | Discharge: 2020-11-18 | Disposition: A | Discharge status: Home / Self Care | Payer: BLUE CROSS/BLUE SHIELD | Attending: Otolaryngology | Admitting: Otolaryngology

## 2020-11-18 DIAGNOSIS — Z91048 Other nonmedicinal substance allergy status: Secondary | ICD-10-CM | POA: Insufficient documentation

## 2020-11-18 DIAGNOSIS — J342 Deviated nasal septum: Secondary | ICD-10-CM | POA: Insufficient documentation

## 2020-11-18 DIAGNOSIS — Z79899 Other long term (current) drug therapy: Secondary | ICD-10-CM | POA: Insufficient documentation

## 2020-11-18 DIAGNOSIS — J343 Hypertrophy of nasal turbinates: Secondary | ICD-10-CM | POA: Insufficient documentation

## 2020-11-18 DIAGNOSIS — Z7984 Long term (current) use of oral hypoglycemic drugs: Secondary | ICD-10-CM | POA: Diagnosis not present

## 2020-11-18 DIAGNOSIS — J988 Other specified respiratory disorders: Secondary | ICD-10-CM | POA: Insufficient documentation

## 2020-11-18 HISTORY — DX: Irritable bowel syndrome, unspecified: K58.9

## 2020-11-18 HISTORY — DX: Depression, unspecified: F32.A

## 2020-11-18 HISTORY — DX: Unspecified osteoarthritis, unspecified site: M19.90

## 2020-11-18 HISTORY — DX: Anemia, unspecified: D64.9

## 2020-11-18 HISTORY — DX: Gastro-esophageal reflux disease without esophagitis: K21.9

## 2020-11-18 HISTORY — PX: NASAL SEPTOPLASTY W/ TURBINOPLASTY: SHX2070

## 2020-11-18 LAB — CBC
HCT: 42.2 % (ref 36.0–46.0)
Hemoglobin: 14.2 g/dL (ref 12.0–15.0)
MCH: 29.8 pg (ref 26.0–34.0)
MCHC: 33.6 g/dL (ref 30.0–36.0)
MCV: 88.7 fL (ref 80.0–100.0)
Platelets: 288 10*3/uL (ref 150–400)
RBC: 4.76 MIL/uL (ref 3.87–5.11)
RDW: 12.4 % (ref 11.5–15.5)
WBC: 5.3 10*3/uL (ref 4.0–10.5)
nRBC: 0 % (ref 0.0–0.2)

## 2020-11-18 LAB — POCT PREGNANCY, URINE: Preg Test, Ur: NEGATIVE

## 2020-11-18 SURGERY — SEPTOPLASTY, NOSE, WITH NASAL TURBINATE REDUCTION
Anesthesia: General | Site: Nose | Laterality: Bilateral

## 2020-11-18 MED ORDER — OXYMETAZOLINE HCL 0.05 % NA SOLN
NASAL | Status: DC | PRN
  Administered 2020-11-18: 2 via NASAL

## 2020-11-18 MED ORDER — OXYCODONE HCL 5 MG/5ML PO SOLN
5.0000 mg | Freq: Once | ORAL | Status: DC | PRN
Start: 2020-11-18 — End: 2020-11-18

## 2020-11-18 MED ORDER — OXYMETAZOLINE HCL 0.05 % NA SOLN
NASAL | Status: AC
  Filled 2020-11-18: qty 30

## 2020-11-18 MED ORDER — FENTANYL CITRATE (PF) 250 MCG/5ML IJ SOLN
INTRAMUSCULAR | Status: AC
  Filled 2020-11-18: qty 5

## 2020-11-18 MED ORDER — FENTANYL CITRATE (PF) 250 MCG/5ML IJ SOLN
INTRAMUSCULAR | Status: DC | PRN
  Administered 2020-11-18: 50 ug via INTRAVENOUS
  Administered 2020-11-18: 100 ug via INTRAVENOUS
  Administered 2020-11-18 (×2): 50 ug via INTRAVENOUS

## 2020-11-18 MED ORDER — CHLORHEXIDINE GLUCONATE 0.12 % MT SOLN: 15.0000 mL | Freq: Once | OROMUCOSAL | Status: AC

## 2020-11-18 MED ORDER — LACTATED RINGERS IV SOLN: INTRAVENOUS | Status: DC

## 2020-11-18 MED ORDER — DEXAMETHASONE SODIUM PHOSPHATE 10 MG/ML IJ SOLN
INTRAMUSCULAR | Status: DC | PRN
  Administered 2020-11-18: 4 mg via INTRAVENOUS

## 2020-11-18 MED ORDER — LIDOCAINE 2% (20 MG/ML) 5 ML SYRINGE
INTRAMUSCULAR | Status: AC
  Filled 2020-11-18: qty 10

## 2020-11-18 MED ORDER — OXYMETAZOLINE HCL 0.05 % NA SOLN
NASAL | Status: DC | PRN
  Administered 2020-11-18: 1

## 2020-11-18 MED ORDER — CEFAZOLIN SODIUM-DEXTROSE 2-4 GM/100ML-% IV SOLN
INTRAVENOUS | Status: AC
  Filled 2020-11-18: qty 100

## 2020-11-18 MED ORDER — MUPIROCIN 2 % EX OINT
TOPICAL_OINTMENT | CUTANEOUS | Status: DC | PRN
  Administered 2020-11-18: 1 via NASAL

## 2020-11-18 MED ORDER — ONDANSETRON HCL 4 MG/2ML IJ SOLN: 4.0000 mg | Freq: Once | INTRAMUSCULAR | Status: DC | PRN

## 2020-11-18 MED ORDER — MIDAZOLAM HCL 2 MG/2ML IJ SOLN
INTRAMUSCULAR | Status: AC
  Filled 2020-11-18: qty 2

## 2020-11-18 MED ORDER — CEFAZOLIN SODIUM-DEXTROSE 2-3 GM-%(50ML) IV SOLR
INTRAVENOUS | Status: DC | PRN
  Administered 2020-11-18: 2 g via INTRAVENOUS

## 2020-11-18 MED ORDER — MIDAZOLAM HCL 5 MG/5ML IJ SOLN
INTRAMUSCULAR | Status: DC | PRN
  Administered 2020-11-18: 2 mg via INTRAVENOUS

## 2020-11-18 MED ORDER — ONDANSETRON HCL 4 MG/2ML IJ SOLN
INTRAMUSCULAR | Status: DC | PRN
  Administered 2020-11-18: 4 mg via INTRAVENOUS

## 2020-11-18 MED ORDER — PROPOFOL 10 MG/ML IV BOLUS
INTRAVENOUS | Status: DC | PRN
  Administered 2020-11-18: 120 mg via INTRAVENOUS

## 2020-11-18 MED ORDER — 0.9 % SODIUM CHLORIDE (POUR BTL) OPTIME
TOPICAL | Status: DC | PRN
  Administered 2020-11-18: 1000 mL

## 2020-11-18 MED ORDER — OXYCODONE HCL 5 MG PO TABS
5.0000 mg | ORAL_TABLET | Freq: Once | ORAL | Status: DC | PRN
Start: 2020-11-18 — End: 2020-11-18

## 2020-11-18 MED ORDER — ONDANSETRON HCL 4 MG/2ML IJ SOLN
INTRAMUSCULAR | Status: AC
  Filled 2020-11-18: qty 2

## 2020-11-18 MED ORDER — ORAL CARE MOUTH RINSE: 15.0000 mL | Freq: Once | OROMUCOSAL | Status: AC

## 2020-11-18 MED ORDER — CEPHALEXIN 500 MG PO CAPS: 500.0000 mg | ORAL_CAPSULE | Freq: Three times a day (TID) | ORAL | 0 refills | Status: AC

## 2020-11-18 MED ORDER — ROCURONIUM BROMIDE 10 MG/ML (PF) SYRINGE
PREFILLED_SYRINGE | INTRAVENOUS | Status: AC
  Filled 2020-11-18: qty 20

## 2020-11-18 MED ORDER — ROCURONIUM BROMIDE 10 MG/ML (PF) SYRINGE
PREFILLED_SYRINGE | INTRAVENOUS | Status: DC | PRN
  Administered 2020-11-18: 60 mg via INTRAVENOUS

## 2020-11-18 MED ORDER — LIDOCAINE 2% (20 MG/ML) 5 ML SYRINGE
INTRAMUSCULAR | Status: DC | PRN
  Administered 2020-11-18: 40 mg via INTRAVENOUS

## 2020-11-18 MED ORDER — CHLORHEXIDINE GLUCONATE 0.12 % MT SOLN
OROMUCOSAL | Status: AC
  Administered 2020-11-18: 15 mL via OROMUCOSAL
  Filled 2020-11-18: qty 15

## 2020-11-18 MED ORDER — LIDOCAINE-EPINEPHRINE 1 %-1:100000 IJ SOLN
INTRAMUSCULAR | Status: DC | PRN
  Administered 2020-11-18: 6 mL

## 2020-11-18 MED ORDER — DEXAMETHASONE SODIUM PHOSPHATE 10 MG/ML IJ SOLN
INTRAMUSCULAR | Status: AC
  Filled 2020-11-18: qty 1

## 2020-11-18 MED ORDER — SUGAMMADEX SODIUM 200 MG/2ML IV SOLN
INTRAVENOUS | Status: DC | PRN
  Administered 2020-11-18: 200 mg via INTRAVENOUS

## 2020-11-18 MED ORDER — PROPOFOL 10 MG/ML IV BOLUS
INTRAVENOUS | Status: AC
  Filled 2020-11-18: qty 20

## 2020-11-18 MED ORDER — FENTANYL CITRATE (PF) 100 MCG/2ML IJ SOLN: 25.0000 ug | INTRAMUSCULAR | Status: DC | PRN

## 2020-11-18 MED ORDER — LIDOCAINE-EPINEPHRINE 1 %-1:100000 IJ SOLN
INTRAMUSCULAR | Status: AC
  Filled 2020-11-18: qty 1

## 2020-11-18 MED ORDER — MUPIROCIN 2 % EX OINT
TOPICAL_OINTMENT | CUTANEOUS | Status: AC
  Filled 2020-11-18: qty 22

## 2020-11-18 SURGICAL SUPPLY — 24 items
CANISTER SUCT 3000ML PPV (MISCELLANEOUS) ×2 IMPLANT
COAGULATOR SUCT SWTCH 10FR 6 (ELECTROSURGICAL) IMPLANT
COVER WAND RF STERILE (DRAPES) ×2 IMPLANT
DRAPE HALF SHEET 40X57 (DRAPES) IMPLANT
ELECT REM PT RETURN 9FT ADLT (ELECTROSURGICAL)
ELECTRODE REM PT RTRN 9FT ADLT (ELECTROSURGICAL) IMPLANT
GAUZE SPONGE 2X2 8PLY STRL LF (GAUZE/BANDAGES/DRESSINGS) ×1 IMPLANT
GLOVE BIOGEL M 7.0 STRL (GLOVE) ×4 IMPLANT
GOWN STRL REUS W/ TWL LRG LVL3 (GOWN DISPOSABLE) ×2 IMPLANT
GOWN STRL REUS W/TWL LRG LVL3 (GOWN DISPOSABLE) ×2
KIT BASIN OR (CUSTOM PROCEDURE TRAY) ×2 IMPLANT
KIT TURNOVER KIT B (KITS) ×2 IMPLANT
NEEDLE HYPO 25GX1X1/2 BEV (NEEDLE) ×2 IMPLANT
NS IRRIG 1000ML POUR BTL (IV SOLUTION) ×2 IMPLANT
PAD ARMBOARD 7.5X6 YLW CONV (MISCELLANEOUS) ×2 IMPLANT
SPLINT NASAL DOYLE BI-VL (GAUZE/BANDAGES/DRESSINGS) ×2 IMPLANT
SPONGE GAUZE 2X2 STER 10/PKG (GAUZE/BANDAGES/DRESSINGS) ×1
SPONGE NEURO XRAY DETECT 1X3 (DISPOSABLE) ×2 IMPLANT
SUT ETHILON 3 0 PS 1 (SUTURE) ×2 IMPLANT
SUT PLAIN 4 0 ~~LOC~~ 1 (SUTURE) ×2 IMPLANT
TOWEL GREEN STERILE FF (TOWEL DISPOSABLE) ×2 IMPLANT
TRAY ENT MC OR (CUSTOM PROCEDURE TRAY) ×2 IMPLANT
TUBE SALEM SUMP 16 FR W/ARV (TUBING) ×2 IMPLANT
TUBING EXTENTION W/L.L. (IV SETS) ×2 IMPLANT

## 2020-11-18 NOTE — Op Note (Signed)
Operative Note: SEPTOPLASTY AND INFERIOR TURBINATE REDUCTION  Patient: Cassandra Mckay  Medical record number: [id number]  Date:11/18/2020  Pre-operative Indications: 1. Deviated nasal septum with nasal airway obstruction     2.  Bilateral inferior turbinate hypertrophy  Postoperative Indications: Same  Surgical Procedure: 1.  Nasal Septoplasty    2.  Bilateral Inferior Turbinate Reduction  Anesthesia: GET  Surgeon: Barbee Cough, M.D.  Complications: None  EBL: 50 cc  Findings: Severely deviated nasal septum with airway obstruction and bilateral inferior turbinate hypertrophy.   Brief History: The patient is a 35 y.o. female with a history of progressive nasal airway obstruction. The patient has been on medical therapy to reduce nasal mucosal edema including saline nasal spray and topical nasal steroids. Despite appropriate medical therapy the patient continues to have ongoing symptoms. Given the patient's history and findings, the above surgical procedures were recommended, risks and benefits were discussed in detail with the patient may understand and agree with our plan for surgery which is scheduled at [facility] OR under general anesthesia as an outpatient.  Surgical Procedure: The patient is brought to the operating room on 11/18/2020 and placed in supine position on the operating table. General endotracheal anesthesia was established without difficulty. When the patient was adequately anesthetized, surgical timeout was performed and correct identification of the patient and the surgical procedure. The patient's nose was then injected with  6 cc of 1% lidocaine 1 100,000 dilution epinephrine which was injected in a submucosal fashion. The patient's nose was then packed with Afrin-soaked cottonoid pledgets were left in place for approximately 10 minutes lateral vasoconstriction and hemostasis.  With the patient prepped draped and prepared for surgery, nasal septoplasty was  begun.  A left anterior hemitransfixion incision was created and a mucoperichondrial flap was elevated from anterior to posterior on the left-hand side. The anterior cartilaginous septum was crossed at the midline and a mucoperichondrial flap was elevated on the patient's right.  Swivel knife was then used to resect the anterior and mid cartilaginous portion of the nasal septum.  Resected cartilage was morcellized and returned to the mucoperichondrial pocket at the occlusion of the surgical procedure.  Dissection was then carried out from anterior to posterior removing deviated bone and cartilage including a large septal spur the overlying mucosa was preserved.  With the septum brought to good midline position, the morselized cartilage was returned to the mucoperichondrial pocket and the soft tissue/mucosal flaps were reapproximated with interrupted 4-0 gut suture on a Keith needle in a horizontal mattressing fashion.  Anterior hemitransfixion incision was closed with the same stitch.  Bilateral Doyle nasal septal splints were then placed after the application of Bactroban ointment and sutured in position with a 3-0 Ethilon suture.  Attention was then turned to the inferior turbinates, bilateral inferior turbinate intramural cautery was performed with cautery setting at 12 W.  2 submucosal passes were made in each inferior turbinate.  After completing cautery, anterior vertical incisions were created and overlying soft tissue was elevated, a small amount of turbinate bone was resected.  The turbinates were then outfractured to create a more patent nasal passageway.  Surgical sponge count was correct. An oral gastric tube was passed and the stomach contents were aspirated. Patient was awakened from anesthetic and transferred from the operating room to the recovery room in stable condition. There were no complications and blood loss was 50cc.   Barbee Cough, M.D. [facility] 11/18/2020

## 2020-11-18 NOTE — Anesthesia Preprocedure Evaluation (Signed)

## 2020-11-18 NOTE — H&P (Signed)
Cassandra Mckay is an 35 y.o. female.   Chief Complaint: Nasal Obstruction  HPI: Hx of nasal obstruction  Past Medical History:  Diagnosis Date  . Anemia    as a teenager  . Arthritis   . Depression   . GAD (generalized anxiety disorder)    Not currently  . GERD (gastroesophageal reflux disease)   . IBS (irritable bowel syndrome)   . PCOS (polycystic ovarian syndrome)     Past Surgical History:  Procedure Laterality Date  . DRUG INDUCED ENDOSCOPY    . VAGINAL DELIVERY      Family History  Problem Relation Age of Onset  . Thyroid disease Mother   . Thyroid disease Maternal Grandmother    Social History:  reports that she has never smoked. She has never used smokeless tobacco. She reports current alcohol use. She reports that she does not use drugs.  Allergies:  Allergies  Allergen Reactions  . Benadryl [Diphenhydramine Hcl] Other (See Comments)    Family history; Pt does not want to try.  . Tape Rash    Paper tape    Medications Prior to Admission  Medication Sig Dispense Refill  . acetaminophen (TYLENOL) 325 MG tablet Take 325 mg by mouth every 6 (six) hours as needed (pain/fever).    Marland Kitchen buPROPion (WELLBUTRIN SR) 150 MG 12 hr tablet Take 150 mg by mouth 2 (two) times daily.    . Carboxymethylcellulose Sodium (THERATEARS) 0.25 % SOLN Place 1-2 drops into both eyes 3 (three) times daily as needed (dry/irritated eyes).    . citalopram (CELEXA) 20 MG tablet Take 20 mg by mouth in the morning.    . metFORMIN (GLUCOPHAGE) 1000 MG tablet Take 1,000 mg by mouth in the morning and at bedtime.    . Multiple Vitamin (MULTIVITAMIN WITH MINERALS) TABS tablet Take 1 tablet by mouth in the morning. One-A-Day for Women      Results for orders placed or performed during the hospital encounter of 11/18/20 (from the past 48 hour(s))  Pregnancy, urine POC     Status: None   Collection Time: 11/18/20  7:28 AM  Result Value Ref Range   Preg Test, Ur NEGATIVE NEGATIVE    Comment:         THE SENSITIVITY OF THIS METHODOLOGY IS >24 mIU/mL   CBC per protocol     Status: None   Collection Time: 11/18/20  7:59 AM  Result Value Ref Range   WBC 5.3 4.0 - 10.5 K/uL   RBC 4.76 3.87 - 5.11 MIL/uL   Hemoglobin 14.2 12.0 - 15.0 g/dL   HCT 54.6 56.8 - 12.7 %   MCV 88.7 80.0 - 100.0 fL   MCH 29.8 26.0 - 34.0 pg   MCHC 33.6 30.0 - 36.0 g/dL   RDW 51.7 00.1 - 74.9 %   Platelets 288 150 - 400 K/uL   nRBC 0.0 0.0 - 0.2 %    Comment: Performed at [facility], [address] [place], [place] [address]   No results found.  Review of Systems  Constitutional: Negative.   HENT: Positive for congestion.   Respiratory: Negative.   Cardiovascular: Negative.     Blood pressure 107/69, pulse 67, temperature 98.5 F (36.9 C), temperature source Oral, resp. rate 18, height 5\' 4"  (1.626 m), weight 73 kg, last menstrual period 10/27/2020, SpO2 99 %, unknown if currently breastfeeding. Physical Exam HENT:     Nose:     Comments: Nasal septal deviation Cardiovascular:  Rate and Rhythm: Normal rate.  Pulmonary:     Effort: Pulmonary effort is normal.  Musculoskeletal:     Cervical back: Normal range of motion.      Assessment/Plan Adm for septoplasty and IT reduction  Osborn Coho, MD 11/18/2020, 8:39 AM

## 2020-11-18 NOTE — Transfer of Care (Signed)
Immediate Anesthesia Transfer of Care Note  Patient: Cassandra Mckay  Procedure(s) Performed: NASAL SEPTOPLASTY WITH TURBINATE REDUCTION (Bilateral Nose)  Patient Location: PACU  Anesthesia Type:General  Level of Consciousness: awake, alert , oriented and patient cooperative  Airway & Oxygen Therapy: Patient Spontanous Breathing and Patient connected to face mask oxygen  Post-op Assessment: Report given to RN, Post -op Vital signs reviewed and stable and Patient moving all extremities X 4  Post vital signs: Reviewed and stable  Last Vitals:  Vitals Value Taken Time  BP 126/89 11/18/20 0958  Temp    Pulse 95 11/18/20 0958  Resp 10 11/18/20 0958  SpO2 98 % 11/18/20 0958  Vitals shown include unvalidated device data.  Last Pain:  Vitals:   11/18/20 0721  TempSrc:   PainSc: 0-No pain         Complications: No complications documented.

## 2020-11-18 NOTE — Anesthesia Procedure Notes (Signed)
Procedure Name: Intubation Date/Time: 11/18/2020 9:12 AM Performed by: Colin Benton, CRNA Pre-anesthesia Checklist: Patient identified, Emergency Drugs available, Suction available and Patient being monitored Patient Re-evaluated:Patient Re-evaluated prior to induction Oxygen Delivery Method: Circle system utilized Preoxygenation: Pre-oxygenation with 100% oxygen Induction Type: IV induction Ventilation: Mask ventilation without difficulty and Oral airway inserted - appropriate to patient size Laryngoscope Size: Mac and 3 Grade View: Grade I Tube type: Oral Tube size: 7.5 mm Number of attempts: 1 Airway Equipment and Method: Stylet and Oral airway Placement Confirmation: ETT inserted through vocal cords under direct vision,  positive ETCO2 and breath sounds checked- equal and bilateral Secured at: 22 cm Tube secured with: Tape Dental Injury: Teeth and Oropharynx as per pre-operative assessment  Comments: Easy BMV. DL x 1 by paramedic student.  Grade 3 view.  DL x 2 by CRNA with grade 1 view.  EBBS and VSS.

## 2020-11-18 NOTE — Progress Notes (Signed)
Pt c/o itching around her nose, upper lip, and eyelids-- assuming it is from cleaning solution for surgery. This RN applied water-based lubricant to help with irritation. No signs of rash or redness. Will continue to monitor.

## 2020-11-18 NOTE — Anesthesia Postprocedure Evaluation (Signed)
Anesthesia Post Note  Patient: Cassandra Mckay  Procedure(s) Performed: NASAL SEPTOPLASTY WITH TURBINATE REDUCTION (Bilateral Nose)     Patient location during evaluation: PACU Anesthesia Type: General Level of consciousness: awake and alert Pain management: pain level controlled Vital Signs Assessment: post-procedure vital signs reviewed and stable Respiratory status: spontaneous breathing, nonlabored ventilation, respiratory function stable and patient connected to nasal cannula oxygen Cardiovascular status: blood pressure returned to baseline and stable Postop Assessment: no apparent nausea or vomiting Anesthetic complications: no   No complications documented.  Last Vitals:  Vitals:   11/18/20 1015 11/18/20 1028  BP: 125/79 112/78  Pulse: 84 79  Resp: 11 11  Temp:  36.7 C  SpO2: 97% 98%    Last Pain:  Vitals:   11/18/20 1028  TempSrc:   PainSc: 2                  Quintell Bonnin COKER

## 2020-11-19 ENCOUNTER — Encounter (HOSPITAL_COMMUNITY): Payer: Self-pay | Admitting: Otolaryngology
